# Patient Record
Sex: Male | Born: 1970 | Race: Asian | Hispanic: No | Marital: Married | Smoking: Never smoker
Health system: Southern US, Community
[De-identification: ages and names within clinical notes are randomized; demographics above are authoritative.]

## PROBLEM LIST (undated history)

## (undated) DIAGNOSIS — R7989 Other specified abnormal findings of blood chemistry: Secondary | ICD-10-CM

## (undated) DIAGNOSIS — N200 Calculus of kidney: Secondary | ICD-10-CM

## (undated) DIAGNOSIS — K760 Fatty (change of) liver, not elsewhere classified: Secondary | ICD-10-CM

## (undated) DIAGNOSIS — R945 Abnormal results of liver function studies: Secondary | ICD-10-CM

## (undated) HISTORY — DX: Abnormal results of liver function studies: R94.5

## (undated) HISTORY — DX: Fatty (change of) liver, not elsewhere classified: K76.0

## (undated) HISTORY — DX: Calculus of kidney: N20.0

## (undated) HISTORY — DX: Other specified abnormal findings of blood chemistry: R79.89

---
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
## 2008-08-11 HISTORY — PX: ESOPHAGOGASTRODUODENOSCOPY: SHX1529

## 2014-09-06 ENCOUNTER — Emergency Department (INDEPENDENT_AMBULATORY_CARE_PROVIDER_SITE_OTHER): Payer: 59

## 2014-09-06 ENCOUNTER — Emergency Department
Admission: EM | Admit: 2014-09-06 | Discharge: 2014-09-06 | Disposition: A | Discharge status: Home / Self Care | Payer: 59 | Source: Home / Self Care | Attending: Family Medicine | Admitting: Family Medicine

## 2014-09-06 ENCOUNTER — Encounter: Payer: Self-pay | Admitting: *Deleted

## 2014-09-06 DIAGNOSIS — B9789 Other viral agents as the cause of diseases classified elsewhere: Principal | ICD-10-CM

## 2014-09-06 DIAGNOSIS — R0602 Shortness of breath: Secondary | ICD-10-CM

## 2014-09-06 DIAGNOSIS — R05 Cough: Secondary | ICD-10-CM

## 2014-09-06 DIAGNOSIS — J069 Acute upper respiratory infection, unspecified: Secondary | ICD-10-CM

## 2014-09-06 MED ORDER — BENZONATATE 200 MG PO CAPS: 200.0000 mg | ORAL_CAPSULE | Freq: Every day | ORAL | Status: DC

## 2014-09-06 MED ORDER — CLARITHROMYCIN 250 MG PO TABS: ORAL_TABLET | ORAL | Status: DC

## 2014-09-06 NOTE — ED Notes (Signed)
James Lane c/o 3-4 days of cough, SOB with talking and exertion, sweats, HA and congestion. He c/o CP with breathing.

## 2014-09-06 NOTE — Discharge Instructions (Signed)
Take plain guaifenesin 1200mg  (Mucinex) twice daily, with plenty of water, for cough and congestion.  May add Pseudoephedrine for sinus congestion.  Get adequate rest.   May use Afrin nasal spray (or generic oxymetazoline) twice daily for about 5 days.  Also recommend using saline nasal spray several times daily and saline nasal irrigation (AYR is a common brand) Try warm salt water gargles for sore throat.  Stop all antihistamines for now, and other non-prescription cough/cold preparations. May take Ibuprofen 200mg , 4 tabs every 8 hours with food for sore throat. Begin Clarithromycin (Biaxin) if not improving about one week or if persistent fever develops   Follow-up with family doctor if not improving about10 days.

## 2014-09-06 NOTE — ED Provider Notes (Signed)
CSN: [id number]     Arrival date & time 09/06/14  1711 History   First MD Initiated Contact with Patient 09/06/14 1815     Chief Complaint  Patient presents with  . Cough  . Sinus Problem      HPI Comments: Patient complains of four day history of typical cold-like symptoms including mild sore throat, sinus congestion, headache, fatigue, and cough.   He now complains of shortness of breath with activity and pain in his anterior chest.  His cough is non-productive and worse at night.  He has had sweats but no fever.  The history is provided by the patient.    History reviewed. No pertinent past medical history. Past Surgical History  Procedure Laterality Date  . Appendectomy     History reviewed. No pertinent family history. History  Substance Use Topics  . Smoking status: Never Smoker   . Smokeless tobacco: Never Used  . Alcohol Use: No    Review of Systems + sore throat + cough No pleuritic pain No wheezing + nasal congestion + post-nasal drainage No sinus pain/pressure No itchy/red eyes No earache No hemoptysis + SOB No fever, + chills/sweats No nausea No vomiting No abdominal pain No diarrhea No urinary symptoms No skin rash + fatigue No myalgias + headache Used OTC meds without relief  Allergies  Review of patient's allergies indicates no known allergies.  Home Medications   Prior to Admission medications   Medication Sig Start Date End Date Taking? Authorizing Provider  benzonatate (TESSALON) 200 MG capsule Take 1 capsule (200 mg total) by mouth at bedtime. Take as needed for cough 09/06/14   Lattie HawStephen A Domanique Luckett, MD  clarithromycin (BIAXIN) 250 MG tablet Take one tab by mouth every 12 hours.  Take with food (Rx void after 09/14/14) 09/06/14   Lattie HawStephen A Amous Crewe, MD   BP 124/79 mmHg  Pulse 90  Temp(Src) 98.3 F (36.8 C) (Oral)  Resp 14  Ht 5\' 5"  (1.651 m)  Wt 189 lb (85.73 kg)  BMI 31.45 kg/m2  SpO2 97% Physical Exam Nursing notes and Vital Signs  reviewed. Appearance:  Patient appears stated age, and in no acute distress Eyes:  Pupils are equal, round, and reactive to light and accomodation.  Extraocular movement is intact.  Conjunctivae are not inflamed  Ears:  Canals normal.  Tympanic membranes normal.  Nose:  Mildly congested turbinates.  No sinus tenderness.  Pharynx:  Normal Neck:  Supple.   Tender shotty posterior nodes are palpated bilaterally  Lungs:  Clear to auscultation.  Breath sounds are equal.  Heart:  Regular rate and rhythm without murmurs, rubs, or gallops.  Abdomen:  Nontender without masses or hepatosplenomegaly.  Bowel sounds are present.  No CVA or flank tenderness.  Extremities:  No edema.  No calf tenderness Skin:  No rash present.   ED Course  Procedures  none    Imaging Review Dg Chest 2 View  09/06/2014   CLINICAL DATA:  Shortness of breath and cough for 2 days. Pain in the left upper chest with coughing.  EXAM: CHEST  2 VIEW  COMPARISON:  None.  FINDINGS: Trachea is midline. Heart size normal. Lungs are somewhat low in volume. Probable scarring in the lingula. No airspace consolidation or pleural fluid.  IMPRESSION: No acute findings.   Electronically Signed   By: Leanna BattlesMelinda  Blietz M.D.   On: 09/06/2014 17:50     MDM   1. Viral URI with cough   2. SOB (shortness of breath)  There is no evidence of bacterial infection today.  Treat symptomatically for now  Prescription written for Benzonatate (Tessalon) to take at bedtime for night-time cough.  Take plain guaifenesin  (Mucinex) twice daily, with plenty of water, for cough and congestion.  May add Pseudoephedrine for sinus congestion.  Get adequate rest.   May use Afrin nasal spray (or generic oxymetazoline) twice daily for about 5 days.  Also recommend using saline nasal spray several times daily and saline nasal irrigation (AYR is a common brand) Try warm salt water gargles for sore throat.  Stop all antihistamines for now, and other  non-prescription cough/cold preparations. May take Ibuprofen , 4 tabs every 8 hours with food for sore throat. Begin Clarithromycin (Biaxin) if not improving about one week or if persistent fever develops (Given a prescription to hold, with an expiration date)  Follow-up with family doctor if not improving about10 days.     Lattie Haw, MD 09/08/14 [phone]

## 2014-09-08 ENCOUNTER — Telehealth: Payer: Self-pay | Admitting: Emergency Medicine

## 2016-11-09 DIAGNOSIS — N200 Calculus of kidney: Secondary | ICD-10-CM

## 2016-11-09 DIAGNOSIS — K760 Fatty (change of) liver, not elsewhere classified: Secondary | ICD-10-CM

## 2016-11-09 HISTORY — DX: Calculus of kidney: N20.0

## 2016-11-09 HISTORY — DX: Fatty (change of) liver, not elsewhere classified: K76.0

## 2016-11-29 ENCOUNTER — Emergency Department (HOSPITAL_BASED_OUTPATIENT_CLINIC_OR_DEPARTMENT_OTHER): Payer: 59

## 2016-11-29 ENCOUNTER — Emergency Department (HOSPITAL_BASED_OUTPATIENT_CLINIC_OR_DEPARTMENT_OTHER)
Admission: EM | Admit: 2016-11-29 | Discharge: 2016-11-29 | Disposition: A | Discharge status: Home / Self Care | Payer: 59 | Attending: Emergency Medicine | Admitting: Emergency Medicine

## 2016-11-29 ENCOUNTER — Encounter (HOSPITAL_BASED_OUTPATIENT_CLINIC_OR_DEPARTMENT_OTHER): Payer: Self-pay | Admitting: *Deleted

## 2016-11-29 DIAGNOSIS — N200 Calculus of kidney: Secondary | ICD-10-CM | POA: Insufficient documentation

## 2016-11-29 DIAGNOSIS — R1030 Lower abdominal pain, unspecified: Secondary | ICD-10-CM | POA: Diagnosis present

## 2016-11-29 LAB — LIPASE, BLOOD: Lipase: 34 U/L (ref 11–51)

## 2016-11-29 LAB — COMPREHENSIVE METABOLIC PANEL
ALT: 66 U/L — ABNORMAL HIGH (ref 17–63)
AST: 48 U/L — ABNORMAL HIGH (ref 15–41)
Albumin: 4.5 g/dL (ref 3.5–5.0)
Alkaline Phosphatase: 62 U/L (ref 38–126)
Anion gap: 8 (ref 5–15)
BUN: 18 mg/dL (ref 6–20)
CALCIUM: 9.1 mg/dL (ref 8.9–10.3)
CO2: 25 mmol/L (ref 22–32)
CREATININE: 1.24 mg/dL (ref 0.61–1.24)
Chloride: 104 mmol/L (ref 101–111)
Glucose, Bld: 147 mg/dL — ABNORMAL HIGH (ref 65–99)
Potassium: 3.6 mmol/L (ref 3.5–5.1)
SODIUM: 137 mmol/L (ref 135–145)
TOTAL PROTEIN: 6.8 g/dL (ref 6.5–8.1)
Total Bilirubin: 0.6 mg/dL (ref 0.3–1.2)

## 2016-11-29 LAB — CBC WITH DIFFERENTIAL/PLATELET
Basophils Absolute: 0 10*3/uL (ref 0.0–0.1)
Basophils Relative: 0 %
EOS ABS: 0 10*3/uL (ref 0.0–0.7)
EOS PCT: 0 %
HCT: 46.4 % (ref 39.0–52.0)
Hemoglobin: 16.3 g/dL (ref 13.0–17.0)
LYMPHS ABS: 0.8 10*3/uL (ref 0.7–4.0)
Lymphocytes Relative: 15 %
MCH: 30 pg (ref 26.0–34.0)
MCHC: 35.1 g/dL (ref 30.0–36.0)
MCV: 85.3 fL (ref 78.0–100.0)
Monocytes Absolute: 0.3 10*3/uL (ref 0.1–1.0)
Monocytes Relative: 5 %
Neutro Abs: 4.5 10*3/uL (ref 1.7–7.7)
Neutrophils Relative %: 80 %
Platelets: 210 10*3/uL (ref 150–400)
RBC: 5.44 MIL/uL (ref 4.22–5.81)
RDW: 12 % (ref 11.5–15.5)
WBC: 5.7 10*3/uL (ref 4.0–10.5)

## 2016-11-29 LAB — URINALYSIS, ROUTINE W REFLEX MICROSCOPIC
BILIRUBIN URINE: NEGATIVE
Glucose, UA: NEGATIVE mg/dL
HGB URINE DIPSTICK: NEGATIVE
Ketones, ur: 15 mg/dL — AB
Leukocytes, UA: NEGATIVE
Nitrite: NEGATIVE
PROTEIN: NEGATIVE mg/dL
Specific Gravity, Urine: 1.019 (ref 1.005–1.030)
pH: 7 (ref 5.0–8.0)

## 2016-11-29 MED ORDER — HYDROMORPHONE HCL 1 MG/ML IJ SOLN
1.0000 mg | Freq: Once | INTRAMUSCULAR | Status: AC
  Administered 2016-11-29: 1 mg via INTRAVENOUS
  Filled 2016-11-29: qty 1

## 2016-11-29 MED ORDER — OXYCODONE-ACETAMINOPHEN 5-325 MG PO TABS
2.0000 | ORAL_TABLET | Freq: Once | ORAL | Status: AC
  Administered 2016-11-29: 2 via ORAL
  Filled 2016-11-29: qty 2

## 2016-11-29 MED ORDER — TAMSULOSIN HCL 0.4 MG PO CAPS: 0.4000 mg | ORAL_CAPSULE | Freq: Every day | ORAL | 0 refills | Status: DC

## 2016-11-29 MED ORDER — SODIUM CHLORIDE 0.9 % IV BOLUS (SEPSIS)
1000.0000 mL | Freq: Once | INTRAVENOUS | Status: AC
  Administered 2016-11-29: 1000 mL via INTRAVENOUS

## 2016-11-29 MED ORDER — KETOROLAC TROMETHAMINE 15 MG/ML IJ SOLN
INTRAMUSCULAR | Status: AC
  Administered 2016-11-29: 10 mg via INTRAVENOUS
  Filled 2016-11-29: qty 1

## 2016-11-29 MED ORDER — IBUPROFEN 800 MG PO TABS: 800.0000 mg | ORAL_TABLET | Freq: Three times a day (TID) | ORAL | 0 refills | Status: DC

## 2016-11-29 MED ORDER — ONDANSETRON HCL 4 MG/2ML IJ SOLN
4.0000 mg | Freq: Once | INTRAMUSCULAR | Status: AC
  Administered 2016-11-29: 4 mg via INTRAVENOUS
  Filled 2016-11-29: qty 2

## 2016-11-29 MED ORDER — TAMSULOSIN HCL 0.4 MG PO CAPS
0.4000 mg | ORAL_CAPSULE | Freq: Once | ORAL | Status: AC
  Administered 2016-11-29: 0.4 mg via ORAL
  Filled 2016-11-29: qty 1

## 2016-11-29 MED ORDER — SODIUM CHLORIDE 0.9 % IV SOLN: 1000.0000 mL | INTRAVENOUS | Status: DC

## 2016-11-29 MED ORDER — ONDANSETRON 4 MG PO TBDP: 4.0000 mg | ORAL_TABLET | ORAL | 0 refills | Status: DC | PRN

## 2016-11-29 MED ORDER — KETOROLAC TROMETHAMINE 15 MG/ML IJ SOLN
10.0000 mg | Freq: Once | INTRAMUSCULAR | Status: AC
  Administered 2016-11-29: 10 mg via INTRAVENOUS

## 2016-11-29 MED ORDER — SODIUM CHLORIDE 0.9 % IV SOLN
1000.0000 mL | Freq: Once | INTRAVENOUS | Status: AC
  Administered 2016-11-29: 1000 mL via INTRAVENOUS

## 2016-11-29 MED ORDER — OXYCODONE-ACETAMINOPHEN 5-325 MG PO TABS: 2.0000 | ORAL_TABLET | ORAL | 0 refills | Status: DC | PRN

## 2016-11-29 NOTE — ED Provider Notes (Signed)
[facility] Provider Note   CSN: [id number] Arrival date & time: 11/29/16  4098     History   Chief Complaint Chief Complaint  Patient presents with  . Flank Pain    HPI James Lane is a 46 y.o. male.  HPI Patient went to bed feeling well. 4 AM sudden onset of severe pain in the right flank and lower abdomen. Pain is sharp. Radiation to the testicle and the penis. Currently he reports most of the pain is now in his lower back. Nausea but no vomiting. Never similar. History of appendectomy. No recent pain burning or urgency with urination. History reviewed. No pertinent past medical history.  There are no active problems to display for this patient.   Past Surgical History:  Procedure Laterality Date  . APPENDECTOMY         Home Medications    Prior to Admission medications   Medication Sig Start Date End Date Taking? Authorizing Provider  benzonatate (TESSALON) 200 MG capsule Take 1 capsule (200 mg total) by mouth at bedtime. Take as needed for cough 09/06/14   Lattie Haw, MD  clarithromycin (BIAXIN) 250 MG tablet Take one tab by mouth every 12 hours.  Take with food (Rx void after 09/14/14) 09/06/14   Lattie Haw, MD    Family History No family history on file.  Social History Social History  Substance Use Topics  . Smoking status: Never Smoker  . Smokeless tobacco: Never Used  . Alcohol use No     Allergies   Patient has no known allergies.   Review of Systems Review of Systems 10 Systems reviewed and are negative for acute change except as noted in the HPI.   Physical Exam Updated Vital Signs BP 120/76 (BP Location: Right Arm)   Pulse 87   Temp 97.1 F (36.2 C) (Oral)   Resp 18   Ht  (1.651 m)   Wt 180 lb (81.6 kg)   SpO2 96%   BMI 29.95 kg/m   Physical Exam  Constitutional: He is oriented to person, place, and time. He appears well-developed and well-nourished.  HENT:  Head: Normocephalic and atraumatic.  Eyes:  Conjunctivae are normal.  Neck: Neck supple.  Cardiovascular: Normal rate and regular rhythm.   No murmur heard. Pulmonary/Chest: Effort normal and breath sounds normal. No respiratory distress.  Abdominal: Soft. There is tenderness.  Mild right lateral quadrant discomfort to palpation. No guarding. No mass or fullness in inguinal canal.  Musculoskeletal: He exhibits no edema or tenderness.  Neurological: He is alert and oriented to person, place, and time. He exhibits normal muscle tone. Coordination normal.  Skin: Skin is warm and dry.  Psychiatric: He has a normal mood and affect.  Nursing note and vitals reviewed.    ED Treatments / Results  Labs (all labs ordered are listed, but only abnormal results are displayed) Labs Reviewed  COMPREHENSIVE METABOLIC PANEL - Abnormal; Notable for the following:       Result Value   Glucose, Bld 147 (*)    AST 48 (*)    ALT 66 (*)    All other components within normal limits  LIPASE, BLOOD  CBC WITH DIFFERENTIAL/PLATELET  URINALYSIS, ROUTINE W REFLEX MICROSCOPIC    EKG  EKG Interpretation None       Radiology Ct Renal Stone Study  Result Date: 11/29/2016 CLINICAL DATA:  Patient with acute onset right flank pain. EXAM: CT ABDOMEN AND PELVIS WITHOUT CONTRAST TECHNIQUE: Multidetector CT imaging  of the abdomen and pelvis was performed following the standard protocol without IV contrast. COMPARISON:  None. FINDINGS: Lower chest: Normal heart size. Dependent atelectasis within the bilateral lower lobes. Hepatobiliary: Liver is diffusely low in attenuation compatible with steatosis. High attenuation adjacent to the gallbladder fossa favored to represent focal fatty sparing. Gallbladder is unremarkable. Pancreas: Unremarkable Spleen: Unremarkable Adrenals/Urinary Tract: The adrenal glands are normal. The right kidney is edematous with perinephric fat stranding. There is mild to moderate right hydronephrosis and ureterectasis to the level of the  distal right ureter at the UVJ were there is an obstructing 4 mm stone (image 78; series 2). No additional renal stones are demonstrated bilaterally. Stomach/Bowel: No abnormal bowel wall thickening or evidence for bowel obstruction. No free fluid or free intraperitoneal air. Normal morphology of the stomach. Vascular/Lymphatic: Normal caliber abdominal aorta. No retroperitoneal lymphadenopathy. Reproductive: Prostate is unremarkable. Other: Soft tissue within the inguinal canals bilaterally favored to represent mildly retracted testicles. Musculoskeletal: No aggressive or acute appearing osseous lesions. IMPRESSION: Obstructing 4 mm stone within the distal right ureter at the UVJ with mild to moderate right hydroureteronephrosis. No additional renal stones are identified. Hepatic steatosis. Electronically Signed   By: Annia Belt M.D.   On: 11/29/2016 08:00    Procedures Procedures (including critical care time)  Medications Ordered in ED Medications  0.9 %  sodium chloride infusion (1,000 mLs Intravenous New Bag/Given 11/29/16 0740)    Followed by  0.9 %  sodium chloride infusion (not administered)  oxyCODONE-acetaminophen (PERCOCET/ROXICET) 5-325 MG per tablet 2 tablet (not administered)  tamsulosin (FLOMAX) capsule 0.4 mg (not administered)  sodium chloride 0.9 % bolus 1,000 mL (not administered)  ketorolac (TORADOL) 15 MG/ML injection 10 mg (10 mg Intravenous Given 11/29/16 0653)  HYDROmorphone (DILAUDID) injection 1 mg (1 mg Intravenous Given 11/29/16 0654)  ondansetron (ZOFRAN) injection 4 mg (4 mg Intravenous Given 11/29/16 0653)  HYDROmorphone (DILAUDID) injection 1 mg (1 mg Intravenous Given 11/29/16 0744)     Initial Impression / Assessment and Plan / ED Course  I have reviewed the triage vital signs and the nursing notes.  Pertinent labs & imaging results that were available during my care of the patient were reviewed by me and considered in my medical decision making (see chart for  details).      Final Clinical Impressions(s) / ED Diagnoses   Final diagnoses:  Kidney stone   4 mm right ureteral stone with mild to moderate hydronephrosis. Patient has an pain controlled with Dilaudid, Toradol and fluids. Urine strainer provided, return precautions reviewed, plan for follow-up with urology. New Prescriptions New Prescriptions   No medications on file     Arby Barrette, MD 11/29/16 [phone]

## 2016-11-29 NOTE — ED Triage Notes (Signed)
c/o back radiating to rt flank and abd

## 2016-11-29 NOTE — ED Notes (Signed)
Patient transported to CT 

## 2016-11-29 NOTE — ED Notes (Signed)
Pt reports vomiting after trying to drink approx 60ml water at one time; instructed pt to take sips of water. MD aware -- okay to DC.

## 2016-11-29 NOTE — ED Notes (Signed)
Voided of yellow urine, strainer used, no stone noted

## 2016-11-29 NOTE — ED Notes (Signed)
Instructed on using urine strainer

## 2016-11-29 NOTE — ED Notes (Signed)
Pt denies n/v at this time.  

## 2017-06-15 ENCOUNTER — Encounter: Payer: Self-pay | Admitting: Family Medicine

## 2017-06-15 ENCOUNTER — Ambulatory Visit (INDEPENDENT_AMBULATORY_CARE_PROVIDER_SITE_OTHER): Payer: 59 | Admitting: Family Medicine

## 2017-06-15 VITALS — BP 117/79 | HR 86 | Temp 98.0°F | Resp 20 | Ht 65.0 in | Wt 182.8 lb

## 2017-06-15 DIAGNOSIS — M799 Soft tissue disorder, unspecified: Secondary | ICD-10-CM | POA: Diagnosis not present

## 2017-06-15 DIAGNOSIS — K760 Fatty (change of) liver, not elsewhere classified: Secondary | ICD-10-CM

## 2017-06-15 DIAGNOSIS — Z0000 Encounter for general adult medical examination without abnormal findings: Secondary | ICD-10-CM | POA: Diagnosis not present

## 2017-06-15 DIAGNOSIS — R748 Abnormal levels of other serum enzymes: Secondary | ICD-10-CM | POA: Insufficient documentation

## 2017-06-15 DIAGNOSIS — R591 Generalized enlarged lymph nodes: Secondary | ICD-10-CM | POA: Diagnosis not present

## 2017-06-15 DIAGNOSIS — M7989 Other specified soft tissue disorders: Secondary | ICD-10-CM | POA: Insufficient documentation

## 2017-06-15 DIAGNOSIS — Z6830 Body mass index (BMI) 30.0-30.9, adult: Secondary | ICD-10-CM

## 2017-06-15 DIAGNOSIS — R7309 Other abnormal glucose: Secondary | ICD-10-CM

## 2017-06-15 DIAGNOSIS — Z7689 Persons encountering health services in other specified circumstances: Secondary | ICD-10-CM | POA: Diagnosis not present

## 2017-06-15 NOTE — Progress Notes (Signed)
Patient ID: James Lane, male  DOB: 1971-03-25, 46 y.o.   MRN: [id number] Patient Care Team    Relationship Specialty Notifications Start End  James Leatherwood, DO PCP - General Family Medicine  06/15/17     Chief Complaint  Patient presents with  . Annual Exam    Subjective:  James Lane is a 46 y.o. male present for CPE and establishment All past medical history, surgical history, allergies, family history, immunizations, medications and social history were obtained and entered in the electronic medical record today. All recent labs, ED visits and hospitalizations within the last year were reviewed. Patient moved to the [place] in 2005 from [place].  His complaints today surround multiple soft tissue masses of bilateral elbows, bilateral thighs. He states the masses in his left anterior thigh have been present for a couple years, however over the last year they have become larger. He states at times they are uncomfortable. He is worried it could be cancerous. He does not feel there is a family history present.  Health maintenance:  Colonoscopy: Start screening at age 31, no family history Immunizations:  tdap completed in 2005, patient will think about immunization, influenza declined Infectious disease screening: HIV completed 2005  Assistive device: None Oxygen use: None Patient has a Dental home. Hospitalizations/ED visits: None  Depression screen [facility] 2/9 06/15/2017  Decreased Interest 0  Down, Depressed, Hopeless 0  PHQ - 2 Score 0   No flowsheet data found.   Current Exercise Habits: Home exercise routine, Type of exercise: walking(biking), Time (Minutes): 30, Frequency (Times/Week): 5, Weekly Exercise (Minutes/Week): 150, Intensity: Moderate   No flowsheet data found.     There is no immunization history on file for this patient.   Past Medical History:  Diagnosis Date  . Elevated LFTs    long term, hepatic steatosis on CT (2018)  . Hepatic steatosis 11/2016   CT finding , pt has elevated LFT for many years  . Kidney stones 11/2016   No Known Allergies Past Surgical History:  Procedure Laterality Date  . APPENDECTOMY  1983  . ESOPHAGOGASTRODUODENOSCOPY  2010   Patient reports completed for GERD symptoms with negative  biopsies.   History reviewed. No pertinent family history. Social History   Socioeconomic History  . Marital status: Married    Spouse name: Not on file  . Number of children: Not on file  . Years of education: 50  . Highest education level: Not on file  Social Needs  . Financial resource strain: Not on file  . Food insecurity - worry: Not on file  . Food insecurity - inability: Not on file  . Transportation needs - medical: Not on file  . Transportation needs - non-medical: Not on file  Occupational History  . Occupation: Art gallery manager  Tobacco Use  . Smoking status: Never Smoker  . Smokeless tobacco: Never Used  Substance and Sexual Activity  . Alcohol use: No  . Drug use: No  . Sexual activity: Yes    Partners: Female  Other Topics Concern  . Not on file  Social History Narrative   Patient is married, they have 1 child.   College-educated, works as an Art gallery manager.   Wears his seatbelt, with uses a bicycle helmet.   Exercises routinely.   Takes a daily vitamin.   Smoke detector in the home.   Feels safe in his relationships.   Allergies as of 06/15/2017   No Known Allergies     Medication List  as of 06/15/2017  4:38 PM   You have not been prescribed any medications.    All past medical history, surgical history, allergies, family history, immunizations andmedications were updated in the EMR today and reviewed under the history and medication portions of their EMR.     No results found for this or any previous visit (from the past 2160 hour(s)).  Ct Renal Stone Study  Result Date: 11/29/2016 CLINICAL DATA:  Patient with acute onset right flank pain. EXAM: CT ABDOMEN AND PELVIS WITHOUT CONTRAST  TECHNIQUE: Multidetector CT imaging of the abdomen and pelvis was performed following the standard protocol without IV contrast. COMPARISON:  None. FINDINGS: Lower chest: Normal heart size. Dependent atelectasis within the bilateral lower lobes. Hepatobiliary: Liver is diffusely low in attenuation compatible with steatosis. High attenuation adjacent to the gallbladder fossa favored to represent focal fatty sparing. Gallbladder is unremarkable. Pancreas: Unremarkable Spleen: Unremarkable Adrenals/Urinary Tract: The adrenal glands are normal. The right kidney is edematous with perinephric fat stranding. There is mild to moderate right hydronephrosis and ureterectasis to the level of the distal right ureter at the UVJ were there is an obstructing 4 mm stone (image 78; series 2). No additional renal stones are demonstrated bilaterally. Stomach/Bowel: No abnormal bowel wall thickening or evidence for bowel obstruction. No free fluid or free intraperitoneal air. Normal morphology of the stomach. Vascular/Lymphatic: Normal caliber abdominal aorta. No retroperitoneal lymphadenopathy. Reproductive: Prostate is unremarkable. Other: Soft tissue within the inguinal canals bilaterally favored to represent mildly retracted testicles. Musculoskeletal: No aggressive or acute appearing osseous lesions. IMPRESSION: Obstructing 4 mm stone within the distal right ureter at the UVJ with mild to moderate right hydroureteronephrosis. No additional renal stones are identified. Hepatic steatosis. Electronically Signed   By: Annia Beltrew  Davis M.D.   On: 11/29/2016 08:00     ROS: 14 pt review of systems performed and negative (unless mentioned in an HPI)  Objective: BP 117/79 (BP Location: Left Arm, Patient Position: Sitting, Cuff Size: Large)   Pulse 86   Temp 98 F (36.7 C)   Resp 20   Ht 5\' 5"  (1.651 m)   Wt 182 lb 12 oz (82.9 kg)   SpO2 98%   BMI 30.41 kg/m  Gen: Afebrile. No acute distress. Nontoxic in appearance,  well-developed, well-nourished,  pleasant Asian male. HENT: AT. [place]. Bilateral TM visualized and normal in appearance, normal external auditory canal. MMM, no oral lesions, adequate dentition. Bilateral nares within normal limits. Throat without erythema, ulcerations or exudates. no Cough on exam, no hoarseness on exam. Eyes:Pupils Equal Round Reactive to light, Extraocular movements intact,  Conjunctiva without redness, discharge or icterus. Neck/lymp/endocrine: Supple,no head/neck lymphadenopathy, no thyromegaly CV: RRR no murmur, no edema, +2/4 P posterior tibialis pulses. No carotid bruits. No JVD. Chest: CTAB, no wheeze, rhonchi or crackles.  Abd: Soft. NTND. BS present. No Masses palpated. No hepatosplenomegaly. No rebound tenderness or guarding. Skin: No rashes, purpura or petechiae. Warm and well-perfused. Skin intact. Neuro/Msk:  Normal gait. PERLA. EOMi. Alert. Oriented x3.  Cranial nerves II through XII intact. ~1 cm bilateral hard mobile mass distal bicep, left superior anterior thigh x2 hard mobile ~3 cm ea. x1 small inguinal/groin mass left. x2 1-2 cm masses right superior anterior thigh. Nontender. Psych: Normal affect, dress and demeanor. Normal speech. Normal thought content and judgment.  No exam data present  Assessment/plan: James Lane is a 46 y.o. male present for CPE Encounter for preventive health examination Patient was encouraged to exercise greater than 150 minutes a  week. Patient was encouraged to choose a diet filled with fresh fruits and vegetables, and lean meats. AVS provided to patient today for education/recommendation on gender specific health and safety maintenance. Declined flu shot today. Thinking about the tetanus vaccination. - Infectious disease screening up-to-date. Mass of soft tissue/lymphadenopathy - Multiple masses bilateral extremities, ? Neurofibroma versus lipoma versus lymphadenopathy. Has been present for some time, with enlarging masses of the left  anterior thigh. Therefore will focus on these for a ultrasound to start evaluation. - [place] MiscellaneoUS Localization; Future - CBC with differential Elevated liver enzymes/elevated liver enzymes Sounds chronic. Patient does have hepatic steatosis by recent CT. This is probable cause of elevated liver enzymes. Monitor. - HIV up-to-date, will complete hepatitis C screen  for elevated enzymes. Elevated glucose/BMI 30 - A1c   Return in about 1 year (around 06/15/2018) for CPE.  Note is dictated utilizing voice recognition software. Although note has been proof read prior to signing, occasional typographical errors still can be missed. If any questions arise, please do not hesitate to call for verification.  Electronically signed by: Felix Pacini, DO [facility] [place]

## 2017-06-15 NOTE — Patient Instructions (Signed)
Think about getting the tetanus shot and flu shot. You will get a call to schedule image of the leg to further investigate the masses.   Schedule a lab visit only, fasting (water only for 9 hours) to have lab work collected within a week.  We will call once results available.     Health Maintenance, Male A healthy lifestyle and preventive care is important for your health and wellness. Ask your health care provider about what schedule of regular examinations is right for you. What should I know about weight and diet? Eat a Healthy Diet  Eat plenty of vegetables, fruits, whole grains, low-fat dairy products, and lean protein.  Do not eat a lot of foods high in solid fats, added sugars, or salt.  Maintain a Healthy Weight Regular exercise can help you achieve or maintain a healthy weight. You should:  Do at least 150 minutes of exercise each week. The exercise should increase your heart rate and make you sweat (moderate-intensity exercise).  Do strength-training exercises at least twice a week.  Watch Your Levels of Cholesterol and Blood Lipids  Have your blood tested for lipids and cholesterol every 5 years starting at 45 years of age. If you are at high risk for heart disease, you should start having your blood tested when you are 45 years old. You may need to have your cholesterol levels checked more often if: ? Your lipid or cholesterol levels are high. ? You are older than 45 years of age. ? You are at high risk for heart disease.  What should I know about cancer screening? Many types of cancers can be detected early and may often be prevented. Lung Cancer  You should be screened every year for lung cancer if: ? You are a current smoker who has smoked for at least 30 years. ? You are a former smoker who has quit within the past 15 years.  Talk to your health care provider about your screening options, when you should start screening, and how often you should be  screened.  Colorectal Cancer  Routine colorectal cancer screening usually begins at 45 years of age and should be repeated every 5-10 years until you are 45 years old. You may need to be screened more often if early forms of precancerous polyps or small growths are found. Your health care provider may recommend screening at an earlier age if you have risk factors for colon cancer.  Your health care provider may recommend using home test kits to check for hidden blood in the stool.  A small camera at the end of a tube can be used to examine your colon (sigmoidoscopy or colonoscopy). This checks for the earliest forms of colorectal cancer.  Prostate and Testicular Cancer  Depending on your age and overall health, your health care provider may do certain tests to screen for prostate and testicular cancer.  Talk to your health care provider about any symptoms or concerns you have about testicular or prostate cancer.  Skin Cancer  Check your skin from head to toe regularly.  Tell your health care provider about any new moles or changes in moles, especially if: ? There is a change in a mole's size, shape, or color. ? You have a mole that is larger than a pencil eraser.  Always use sunscreen. Apply sunscreen liberally and repeat throughout the day.  Protect yourself by wearing long sleeves, pants, a wide-brimmed hat, and sunglasses when outside.  What should I know about heart  disease, diabetes, and high blood pressure?  If you are 80-63 years of age, have your blood pressure checked every 3-5 years. If you are 90 years of age or older, have your blood pressure checked every year. You should have your blood pressure measured twice-once when you are at a hospital or clinic, and once when you are not at a hospital or clinic. Record the average of the two measurements. To check your blood pressure when you are not at a hospital or clinic, you can use: ? An automated blood pressure machine at a  pharmacy. ? A home blood pressure monitor.  Talk to your health care provider about your target blood pressure.  If you are between 62-32 years old, ask your health care provider if you should take aspirin to prevent heart disease.  Have regular diabetes screenings by checking your fasting blood sugar level. ? If you are at a normal weight and have a low risk for diabetes, have this test once every three years after the age of 75. ? If you are overweight and have a high risk for diabetes, consider being tested at a younger age or more often.  A one-time screening for abdominal aortic aneurysm (AAA) by ultrasound is recommended for men aged 65-75 years who are current or former smokers. What should I know about preventing infection? Hepatitis B If you have a higher risk for hepatitis B, you should be screened for this virus. Talk with your health care provider to find out if you are at risk for hepatitis B infection. Hepatitis C Blood testing is recommended for:  Everyone born from [birth year] through 1964.  Anyone with known risk factors for hepatitis C.  Sexually Transmitted Diseases (STDs)  You should be screened each year for STDs including gonorrhea and chlamydia if: ? You are sexually active and are younger than 45 years of age. ? You are older than 45 years of age and your health care provider tells you that you are at risk for this type of infection. ? Your sexual activity has changed since you were last screened and you are at an increased risk for chlamydia or gonorrhea. Ask your health care provider if you are at risk.  Talk with your health care provider about whether you are at high risk of being infected with HIV. Your health care provider may recommend a prescription medicine to help prevent HIV infection.  What else can I do?  Schedule regular health, dental, and eye exams.  Stay current with your vaccines (immunizations).  Do not use any tobacco products, such as  cigarettes, chewing tobacco, and e-cigarettes. If you need help quitting, ask your health care provider.  Limit alcohol intake to no more than 2 drinks per day. One drink equals 12 ounces of beer, 5 ounces of wine, or 1 ounces of hard liquor.  Do not use street drugs.  Do not share needles.  Ask your health care provider for help if you need support or information about quitting drugs.  Tell your health care provider if you often feel depressed.  Tell your health care provider if you have ever been abused or do not feel safe at home. This information is not intended to replace advice given to you by your health care provider. Make sure you discuss any questions you have with your health care provider. Document Released: 01/24/2008 Document Revised: 03/26/2016 Document Reviewed: 05/01/2015 Elsevier Interactive Patient Education  [employer].  Please help [place] help you:  We  are honored you have chosen [facility] for your Primary Care home. Below you will find basic instructions that you may need to access in the future. Please help [place] help you by reading the instructions, which cover many of the frequent questions we experience.   Prescription refills and request:  -In order to allow more efficient response time, please call your pharmacy for all refills. They will forward the request electronically to [place]. This allows for the quickest possible response. Request left on a nurse line can take longer to refill, since these are checked as time allows between office patients and other phone calls.  - refill request can take up to 3-5 working days to complete.  - If request is sent electronically and request is appropiate, it is usually completed in 1-2 business days.  - all patients will need to be seen routinely for all chronic medical conditions requiring prescription medications (see follow-up below). If you are overdue for follow up on your condition, you will be asked to make an  appointment and we will call in enough medication to cover you until your appointment (up to 30 days).  - all controlled substances will require a face to face visit to request/refill.  - if you desire your prescriptions to go through a new pharmacy, and have an active script at original pharmacy, you will need to call your pharmacy and have scripts transferred to new pharmacy. This is completed between the pharmacy locations and not by your provider.    Results: If any images or labs were ordered, it can take up to 1 week to get results depending on the test ordered and the lab/facility running and resulting the test. - Normal or stable results, which do not need further discussion, may be released to your mychart immediately with attached note to you. A call may not be generated for normal results. Please make certain to sign up for mychart. If you have questions on how to activate your mychart you can call the front office.  - If your results need further discussion, our office will attempt to contact you via phone, and if unable to reach you after 2 attempts, we will release your abnormal result to your mychart with instructions.  - All results will be automatically released in mychart after 1 week.  - Your provider will provide you with explanation and instruction on all relevant material in your results. Please keep in mind, results and labs may appear confusing or abnormal to the untrained eye, but it does not mean they are actually abnormal for you personally. If you have any questions about your results that are not covered, or you desire more detailed explanation than what was provided, you should make an appointment with your provider to do so.   Our office handles many outgoing and incoming calls daily. If we have not contacted you within 1 week about your results, please check your mychart to see if there is a message first and if not, then contact our office.  In helping with this matter,  you help decrease call volume, and therefore allow [place] to be able to respond to patients needs more efficiently.   Acute office visits (sick visit):  An acute visit is intended for a new problem and are scheduled in shorter time slots to allow schedule openings for patients with new problems. This is the appropriate visit to discuss a new problem. In order to provide you with excellent quality medical care with proper  time for you to explain your problem, have an exam and receive treatment with instructions, these appointments should be limited to one new problem per visit. If you experience a new problem, in which you desire to be addressed, please make an acute office visit, we save openings on the schedule to accommodate you. Please do not save your new problem for any other type of visit, let us take care of it properly and quickly for you.   Follow up visits:  Depending on your condition(s) your provider will need to see you routinely in order to provide you with quality care and prescribe medication(s). Most chronic conditions (Example: hypertension, Diabetes, depression/anxiety... etc), require visits a couple times a year. Your provider will instruct you on proper follow up for your personal medical conditions and history. Please make certain to make follow up appointments for your condition as instructed. Failing to do so could result in lapse in your medication treatment/refills. If you request a refill, and are overdue to be seen on a condition, we will always provide you with a 30 day script (once) to allow you time to schedule.    Medicare wellness (well visit): - we have a wonderful Nurse Selena Batten), that will meet with you and provide you will yearly medicare wellness visits. These visits should occur yearly (can not be scheduled less than 1 calendar year apart) and cover preventive health, immunizations, advance directives and screenings you are entitled to yearly through your medicare benefits.  Do not miss out on your entitled benefits, this is when medicare will pay for these benefits to be ordered for you.  These are strongly encouraged by your provider and is the appropriate type of visit to make certain you are up to date with all preventive health benefits. If you have not had your medicare wellness exam in the last 12 months, please make certain to schedule one by calling the office and schedule your medicare wellness with Selena Batten as soon as possible.   Yearly physical (well visit):  - Adults are recommended to be seen yearly for physicals. Check with your insurance and date of your last physical, most insurances require one calendar year between physicals. Physicals include all preventive health topics, screenings, medical exam and labs that are appropriate for gender/age and history. You may have fasting labs needed at this visit. This is a well visit (not a sick visit), new problems should not be covered during this visit (see acute visit).  - Pediatric patients are seen more frequently when they are younger. Your provider will advise you on well child visit timing that is appropriate for your their age. - This is not a medicare wellness visit. Medicare wellness exams do not have an exam portion to the visit. Some medicare companies allow for a physical, some do not allow a yearly physical. If your medicare allows a yearly physical you can schedule the medicare wellness with our nurse Selena Batten and have your physical with your provider after, on the same day. Please check with insurance for your full benefits.   Late Policy/No Shows:  - all new patients should arrive 15-30 minutes earlier than appointment to allow [place] time  to  obtain all personal demographics,  insurance information and for you to complete office paperwork. - All established patients should arrive 10-15 minutes earlier than appointment time to update all information and be checked in .  - In our best efforts to run on time, if you  are late for your appointment  you will be asked to either reschedule or if able, we will work you back into the schedule. There will be a wait time to work you back in the schedule,  depending on availability.  - If you are unable to make it to your appointment as scheduled, please call 24 hours ahead of time to allow [place] to fill the time slot with someone else who needs to be seen. If you do not cancel your appointment ahead of time, you may be charged a no show fee.

## 2017-06-18 ENCOUNTER — Other Ambulatory Visit (INDEPENDENT_AMBULATORY_CARE_PROVIDER_SITE_OTHER): Payer: 59

## 2017-06-18 DIAGNOSIS — K760 Fatty (change of) liver, not elsewhere classified: Secondary | ICD-10-CM | POA: Diagnosis not present

## 2017-06-18 DIAGNOSIS — M799 Soft tissue disorder, unspecified: Secondary | ICD-10-CM

## 2017-06-18 DIAGNOSIS — Z6830 Body mass index (BMI) 30.0-30.9, adult: Secondary | ICD-10-CM

## 2017-06-18 DIAGNOSIS — R748 Abnormal levels of other serum enzymes: Secondary | ICD-10-CM

## 2017-06-18 DIAGNOSIS — R7309 Other abnormal glucose: Secondary | ICD-10-CM

## 2017-06-18 DIAGNOSIS — M7989 Other specified soft tissue disorders: Secondary | ICD-10-CM

## 2017-06-18 LAB — CBC WITH DIFFERENTIAL/PLATELET
Basophils Absolute: 0.1 10*3/uL (ref 0.0–0.1)
Basophils Relative: 0.9 % (ref 0.0–3.0)
EOS PCT: 1 % (ref 0.0–5.0)
Eosinophils Absolute: 0.1 10*3/uL (ref 0.0–0.7)
HCT: 49.8 % (ref 39.0–52.0)
HEMOGLOBIN: 16.8 g/dL (ref 13.0–17.0)
Lymphocytes Relative: 25.2 % (ref 12.0–46.0)
Lymphs Abs: 1.5 10*3/uL (ref 0.7–4.0)
MCHC: 33.7 g/dL (ref 30.0–36.0)
MCV: 89.1 fl (ref 78.0–100.0)
MONOS PCT: 6.8 % (ref 3.0–12.0)
Monocytes Absolute: 0.4 10*3/uL (ref 0.1–1.0)
Neutro Abs: 4 10*3/uL (ref 1.4–7.7)
Neutrophils Relative %: 66.1 % (ref 43.0–77.0)
Platelets: 228 10*3/uL (ref 150.0–400.0)
RBC: 5.6 Mil/uL (ref 4.22–5.81)
RDW: 12.6 % (ref 11.5–15.5)
WBC: 6 10*3/uL (ref 4.0–10.5)

## 2017-06-18 LAB — LIPID PANEL
CHOLESTEROL: 154 mg/dL (ref 0–200)
HDL: 34.2 mg/dL — ABNORMAL LOW (ref >39.00)
NonHDL: 119.63
Total CHOL/HDL Ratio: 4
Triglycerides: 243 mg/dL — ABNORMAL HIGH (ref 0.0–149.0)
VLDL: 48.6 mg/dL — AB (ref 0.0–40.0)

## 2017-06-18 LAB — COMPREHENSIVE METABOLIC PANEL
ALBUMIN: 4.5 g/dL (ref 3.5–5.2)
ALK PHOS: 73 U/L (ref 39–117)
ALT: 28 U/L (ref 0–53)
AST: 22 U/L (ref 0–37)
BUN: 14 mg/dL (ref 6–23)
CO2: 32 mEq/L (ref 19–32)
Calcium: 9.8 mg/dL (ref 8.4–10.5)
Chloride: 100 mEq/L (ref 96–112)
Creatinine, Ser: 1.05 mg/dL (ref 0.40–1.50)
GFR: 81.1 mL/min (ref >60.00)
Glucose, Bld: 105 mg/dL — ABNORMAL HIGH (ref 70–99)
Potassium: 4.3 mEq/L (ref 3.5–5.1)
Sodium: 138 mEq/L (ref 135–145)
TOTAL PROTEIN: 7.1 g/dL (ref 6.0–8.3)
Total Bilirubin: 1.1 mg/dL (ref 0.2–1.2)

## 2017-06-18 LAB — HEMOGLOBIN A1C: Hgb A1c MFr Bld: 5.8 % (ref 4.6–6.5)

## 2017-06-18 LAB — LDL CHOLESTEROL, DIRECT: Direct LDL: 90 mg/dL

## 2017-06-19 ENCOUNTER — Telehealth: Payer: Self-pay | Admitting: Family Medicine

## 2017-06-19 DIAGNOSIS — E781 Pure hyperglyceridemia: Secondary | ICD-10-CM

## 2017-06-19 LAB — HEPATITIS C ANTIBODY
Hepatitis C Ab: NONREACTIVE
SIGNAL TO CUT-OFF: 0.01 (ref <1.00)

## 2017-06-19 MED ORDER — OMEGA-3-ACID ETHYL ESTERS 1 G PO CAPS: 2.0000 g | ORAL_CAPSULE | Freq: Every day | ORAL | 3 refills | Status: AC

## 2017-06-19 NOTE — Telephone Encounter (Signed)
Please call pt: - his labs are stable with the following exceptions his good cholesterol is low and his triglycerides are too high. I have called in a medication for him to start called Lovaza which is an omega-3 fish oil supplement. It helps increase the good cholesterol and decrease the triglycerides. If liver enzymes are now normal, they had been abnormal in the past. His diabetes screening is 5.8, which is mildly elevated. He is not a diabetic, however he should increase his exercise and cut back on sugary foods in order to decrease his A1c to normal range and not become a diabetic.   Follow-up in 3-6 months for repeat cholesterol panel after starting medication.

## 2017-06-19 NOTE — Addendum Note (Signed)
Addended by: Thomasena EdisWILLIAMS, Cornelis Kluver N on: 06/19/2017 03:03 PM   Modules accepted: Orders

## 2017-06-22 NOTE — Telephone Encounter (Signed)
Left message with results and instructions on patient voice mail per DPR 

## 2017-06-23 ENCOUNTER — Other Ambulatory Visit: Payer: 59

## 2017-06-30 ENCOUNTER — Ambulatory Visit
Admission: RE | Admit: 2017-06-30 | Discharge: 2017-06-30 | Disposition: A | Discharge status: Home / Self Care | Payer: 59 | Source: Ambulatory Visit | Attending: Family Medicine | Admitting: Family Medicine

## 2017-06-30 DIAGNOSIS — R591 Generalized enlarged lymph nodes: Secondary | ICD-10-CM

## 2017-07-01 ENCOUNTER — Telehealth: Payer: Self-pay | Admitting: Family Medicine

## 2017-07-01 DIAGNOSIS — R2242 Localized swelling, mass and lump, left lower limb: Secondary | ICD-10-CM

## 2017-07-01 NOTE — Telephone Encounter (Signed)
Please call pt: - I received a report on his US of his thigh masses. The radiologist recommend further imaging to get a better look at the masses mid thigh. They are uncertain at this time if just fatty masses called lipomas or a more serious condition and we need a better image to identify,  I placed an order for the MRI of his leg. He will need an appt with me to follow up 2-5 days after MRI completed to discuss all results.    Rosalita Chessman- Suzanne: please call radiology dept and ask what order number they recommend given there is no order for thigh MRI and Femur MRI which is the closest order I can find is not exactly accurate and may be misleading.

## 2017-07-06 NOTE — Telephone Encounter (Signed)
Spoke with patient reviewed US results and instructions. Patient verbalized understanding .  

## 2017-07-06 NOTE — Telephone Encounter (Signed)
MRI ordered placed

## 2017-07-08 ENCOUNTER — Telehealth: Payer: Self-pay | Admitting: *Deleted

## 2017-07-08 NOTE — Telephone Encounter (Signed)
Copied from CRM [id number]. Topic: Inquiry >> Jul 07, 2017  2:05 PM Waymon AmatoBurton, Donna F wrote: Reason for CRM: pt is needing to get his ultrasound results sent to his home address and needs to talk with the provider as does he really need to do an MRI  Best number [phone]   Spoke with patient explained to him if he wanted to review US results with Dr Claiborne BillingsKuneff he would need to schedule an appt. US results printed and placed up front for pick up. Patient states he will pick up copy in the am. Patient states he doesn't understand why the Dr cant just call him on the phone and answer his questions. Explained to patient Dr Claiborne BillingsKuneff will review in person only when additional tests are required so all questions can be addressed at that time. Patient verbalized understanding.

## 2017-08-07 ENCOUNTER — Encounter: Payer: Self-pay | Admitting: Family Medicine

## 2018-08-10 ENCOUNTER — Telehealth: Payer: 59 | Admitting: Physician Assistant

## 2018-08-10 DIAGNOSIS — B9689 Other specified bacterial agents as the cause of diseases classified elsewhere: Secondary | ICD-10-CM

## 2018-08-10 DIAGNOSIS — J019 Acute sinusitis, unspecified: Secondary | ICD-10-CM

## 2018-08-10 MED ORDER — AMOXICILLIN-POT CLAVULANATE 875-125 MG PO TABS: 1.0000 | ORAL_TABLET | Freq: Two times a day (BID) | ORAL | 0 refills | Status: AC

## 2018-08-10 NOTE — Progress Notes (Signed)

## 2018-08-23 ENCOUNTER — Telehealth: Payer: 59 | Admitting: Family

## 2018-08-23 DIAGNOSIS — J019 Acute sinusitis, unspecified: Secondary | ICD-10-CM | POA: Diagnosis not present

## 2018-08-23 MED ORDER — AMOXICILLIN-POT CLAVULANATE 875-125 MG PO TABS: 1.0000 | ORAL_TABLET | Freq: Two times a day (BID) | ORAL | 0 refills | Status: AC

## 2018-08-23 NOTE — Progress Notes (Signed)

## 2018-08-28 IMAGING — CT CT RENAL STONE PROTOCOL
2 of 4 series · 16 of 46 positions shown, 18 images · non-contrast
Comparison: None.

CLINICAL DATA: Patient with acute onset right flank pain.

EXAM:
CT ABDOMEN AND PELVIS WITHOUT CONTRAST
TECHNIQUE: Multidetector CT imaging of the abdomen and pelvis was performed
following the standard protocol without IV contrast.

[Series 2: axial st · axial · 0.86mm/px · z∈[-479,-44]mm · 13 of 95 slices shown, 15 images]
[im 4/95  soft-tissue]
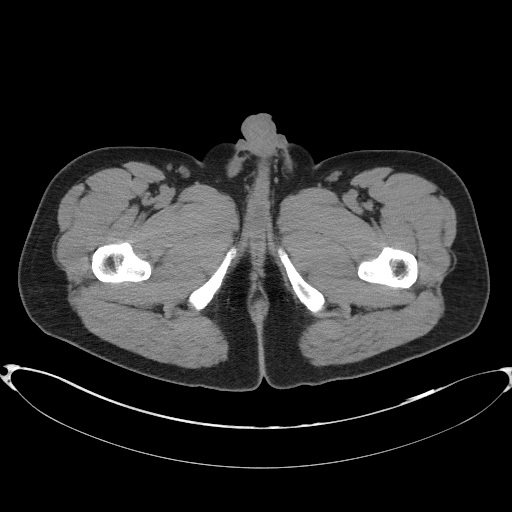
[im 4/95  bone]
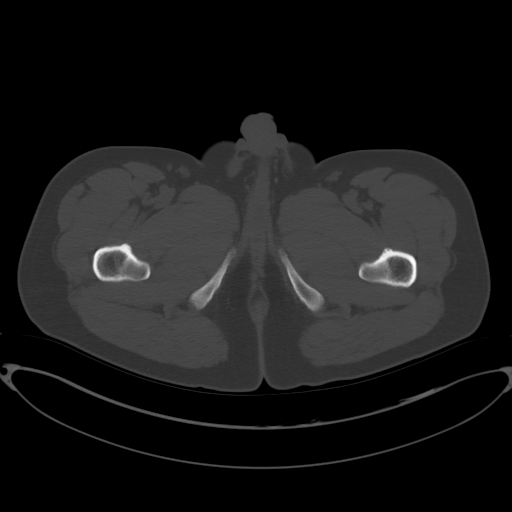
[im 11/95  soft-tissue]
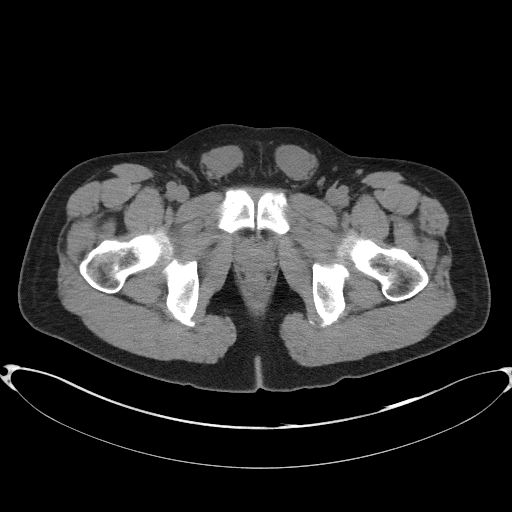
[im 19/95  soft-tissue]
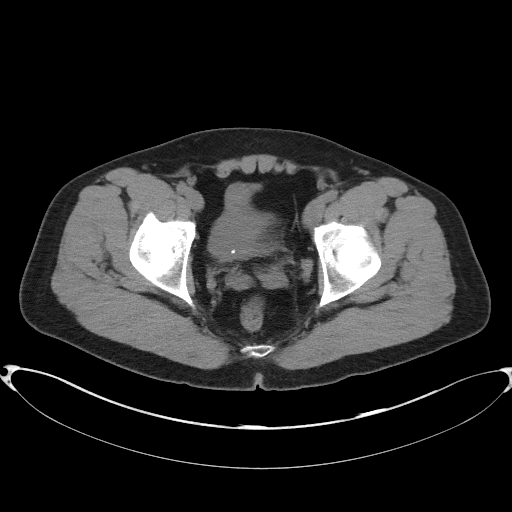
[im 26/95  soft-tissue]
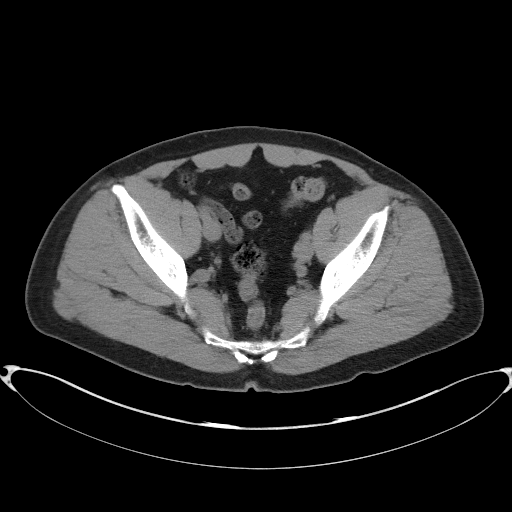
[im 33/95  soft-tissue]
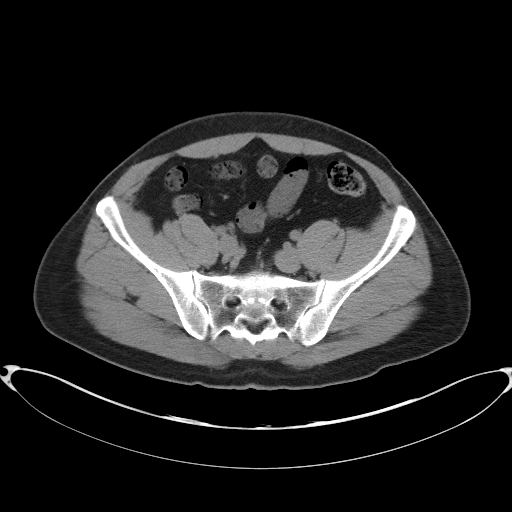
[im 40/95  soft-tissue]
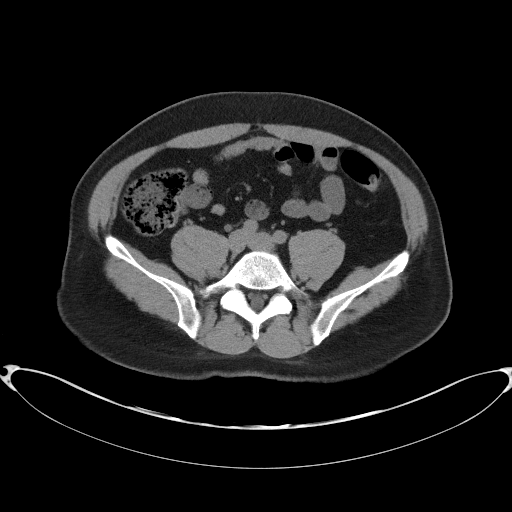
[im 48/95  soft-tissue]
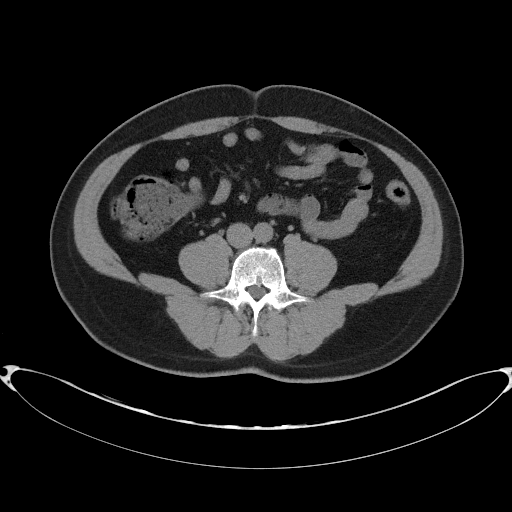
[im 55/95  soft-tissue]
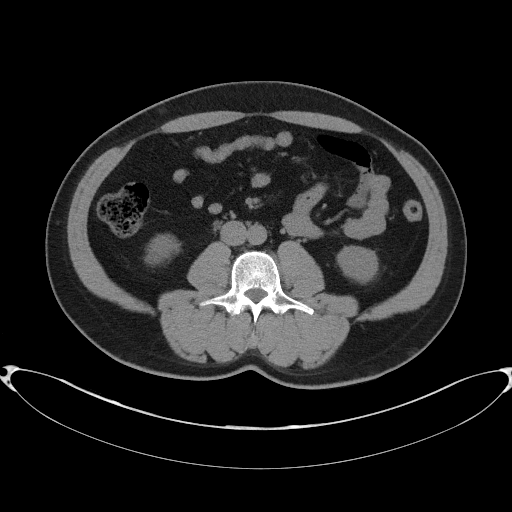
[im 62/95  soft-tissue]
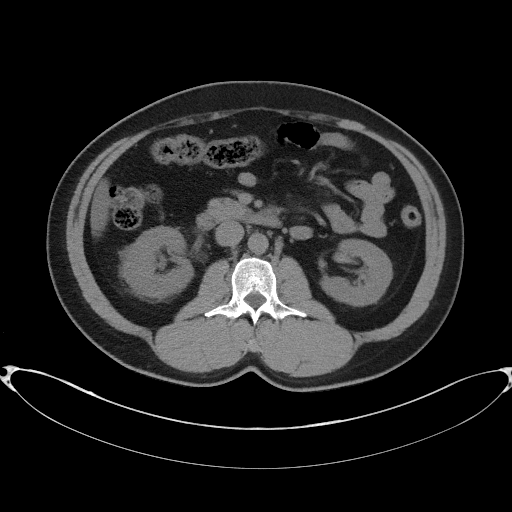
[im 62/95  bone]
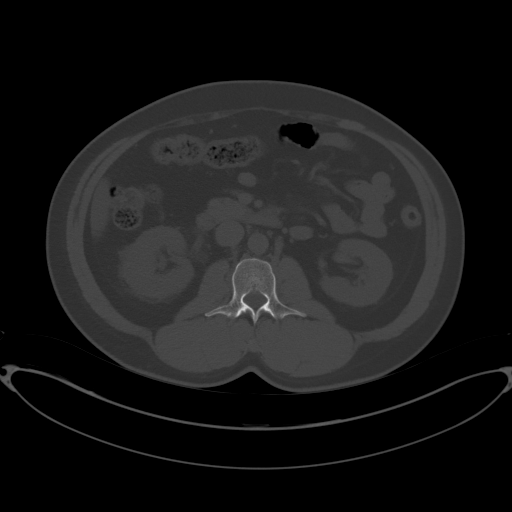
[im 69/95  soft-tissue]
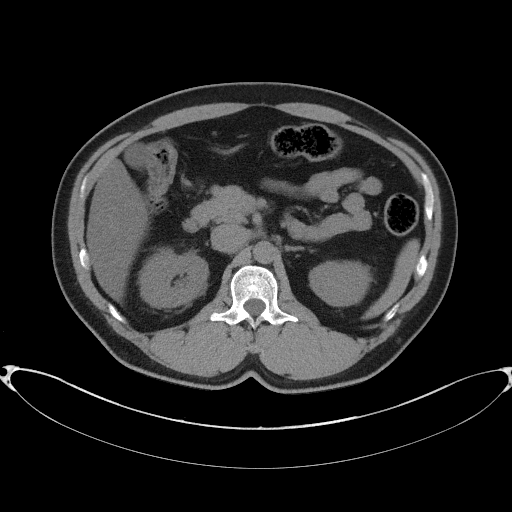
[im 76/95  soft-tissue]
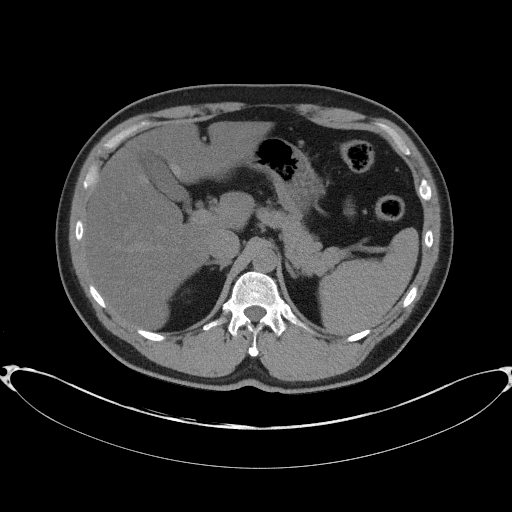
[im 84/95  soft-tissue]
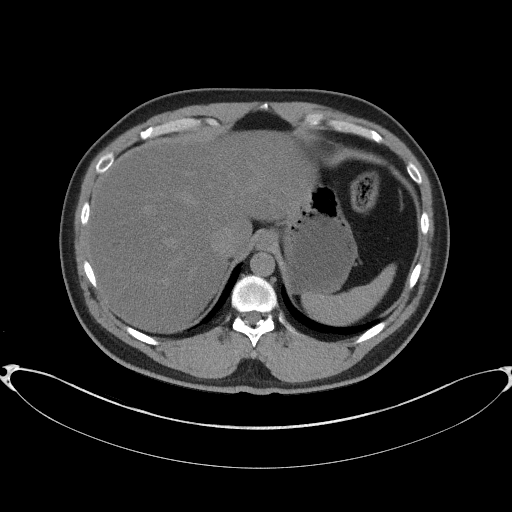
[im 91/95  soft-tissue]
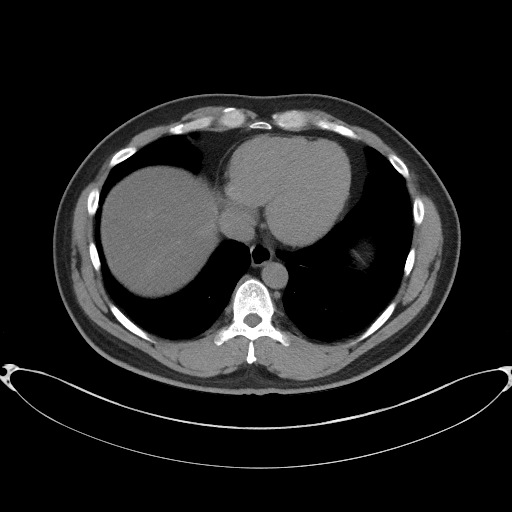

[Series 5: coronal st · coronal · 0.88mm/px · 3 of 86 slices shown]
[im 29/86  soft-tissue]
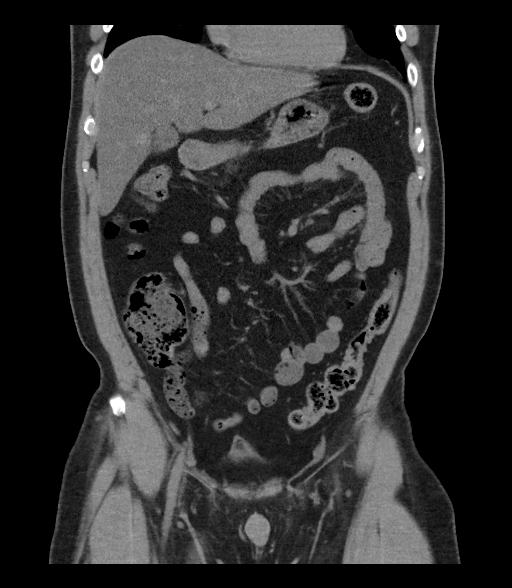
[im 38/86  soft-tissue]
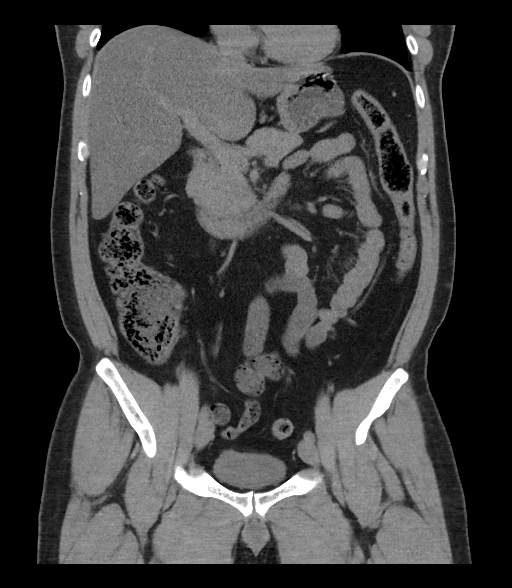
[im 48/86  soft-tissue]
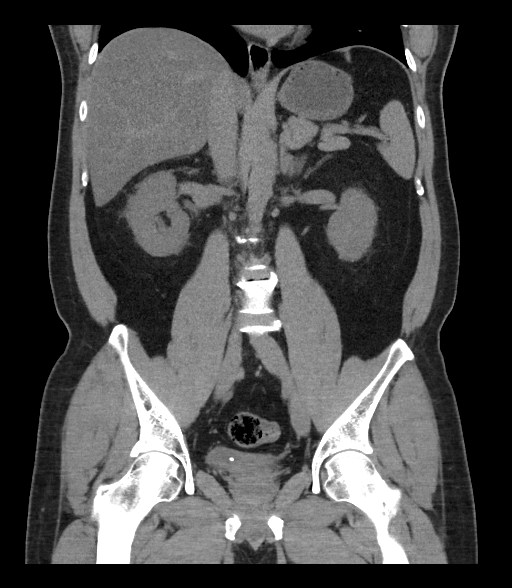

[16 of 46 positions shown; findings below may reference images not displayed]

FINDINGS: Lower chest: Normal heart size. Dependent atelectasis within the
bilateral lower lobes.

Hepatobiliary: Liver is diffusely low in attenuation compatible with
steatosis. High attenuation adjacent to the gallbladder fossa
favored to represent focal fatty sparing. Gallbladder is
unremarkable.

Pancreas: Unremarkable

Spleen: Unremarkable

Adrenals/Urinary Tract: The adrenal glands are normal. The right
kidney is edematous with perinephric fat stranding. There is mild to
moderate right hydronephrosis and ureterectasis to the level of the
distal right ureter at the UVJ were there is an obstructing 4 mm
stone (image 78; series 2). No additional renal stones are
demonstrated bilaterally.

Stomach/Bowel: No abnormal bowel wall thickening or evidence for
bowel obstruction. No free fluid or free intraperitoneal air. Normal
morphology of the stomach.

Vascular/Lymphatic: Normal caliber abdominal aorta. No
retroperitoneal lymphadenopathy.

Reproductive: Prostate is unremarkable.

Other: Soft tissue within the inguinal canals bilaterally favored to
represent mildly retracted testicles.

Musculoskeletal: No aggressive or acute appearing osseous lesions.
IMPRESSION: Obstructing 4 mm stone within the distal right ureter at the UVJ
with mild to moderate right hydroureteronephrosis. No additional
renal stones are identified.

Hepatic steatosis.

## 2018-09-03 ENCOUNTER — Telehealth: Payer: 59 | Admitting: Family

## 2018-09-03 DIAGNOSIS — B9689 Other specified bacterial agents as the cause of diseases classified elsewhere: Secondary | ICD-10-CM

## 2018-09-03 DIAGNOSIS — J329 Chronic sinusitis, unspecified: Secondary | ICD-10-CM | POA: Diagnosis not present

## 2018-09-03 MED ORDER — CEFDINIR 300 MG PO CAPS: 300.0000 mg | ORAL_CAPSULE | Freq: Two times a day (BID) | ORAL | 0 refills | Status: AC

## 2018-09-03 MED ORDER — FLUTICASONE PROPIONATE 50 MCG/ACT NA SUSP: 1.0000 | Freq: Two times a day (BID) | NASAL | 6 refills | Status: AC

## 2018-09-03 NOTE — Progress Notes (Signed)
Thank you for the details you included in the comment boxes. Those details are very helpful in determining the best course of treatment for you and help [place] to provide the best care. Based on your E-visit done 08/23/18, you have been sick for at least 17 days. Apparently, the Augmentin did not help you. Therefore, we can utilize Cefdinir.   Regarding prednisone, there is no evidence to support this during a sinus infection; therefore we cannot give it in this case. However, you may use Flonase locally to help. I will send a prescription for that as well; Flonase 2 sprays per nare daily. See plan below.  We are sorry that you are not feeling well.  Here is how we plan to help!  Based on what you have shared with me it looks like you have sinusitis.  Sinusitis is inflammation and infection in the sinus cavities of the head.  Based on your presentation I believe you most likely have Acute Bacterial Sinusitis.  This is an infection caused by bacteria and is treated with antibiotics. I have prescribed Omnicef 300mg  by mouth twice daily for 10 days. You may use an oral decongestant such as Mucinex D or if you have glaucoma or high blood pressure use plain Mucinex. Saline nasal spray help and can safely be used as often as needed for congestion.  If you develop worsening sinus pain, fever or notice severe headache and vision changes, or if symptoms are not better after completion of antibiotic, please schedule an appointment with a health care provider.    Sinus infections are not as easily transmitted as other respiratory infection, however we still recommend that you avoid close contact with loved ones, especially the very young and elderly.  Remember to wash your hands thoroughly throughout the day as this is the number one way to prevent the spread of infection!  Home Care:  Only take medications as instructed by your medical team.  Complete the entire course of an antibiotic.  Do not take these  medications with alcohol.  A steam or ultrasonic humidifier can help congestion.  You can place a towel over your head and breathe in the steam from hot water coming from a faucet.  Avoid close contacts especially the very young and the elderly.  Cover your mouth when you cough or sneeze.  Always remember to wash your hands.  Get Help Right Away If:  You develop worsening fever or sinus pain.  You develop a severe head ache or visual changes.  Your symptoms persist after you have completed your treatment plan.  Make sure you  Understand these instructions.  Will watch your condition.  Will get help right away if you are not doing well or get worse.  Your e-visit answers were reviewed by a board certified advanced clinical practitioner to complete your personal care plan.  Depending on the condition, your plan could have included both over the counter or prescription medications.  If there is a problem please reply  once you have received a response from your provider.  Your safety is important to [place].  If you have drug allergies check your prescription carefully.    You can use MyChart to ask questions about today's visit, request a non-urgent call back, or ask for a work or school excuse for 24 hours related to this e-Visit. If it has been greater than 24 hours you will need to follow up with your provider, or enter a new e-Visit to address those concerns.  You will get an e-mail in the next two days asking about your experience.  I hope that your e-visit has been valuable and will speed your recovery. Thank you for using e-visits.

## 2019-10-24 IMAGING — US US EXTREM LOW*L* LIMITED
1 series · 13 of 25 positions shown · non-contrast
Comparison: None.

CLINICAL DATA: 2 palpable masses in the left anterior thigh.

EXAM:
ULTRASOUND LEFT LOWER EXTREMITY LIMITED
TECHNIQUE: Ultrasound examination of the lower extremity soft tissues was
performed in the area of clinical concern.

[Series 1: us extrem low*left* limited · 0.06mm/px · 13 of 36 slices shown]
[im 1/36]
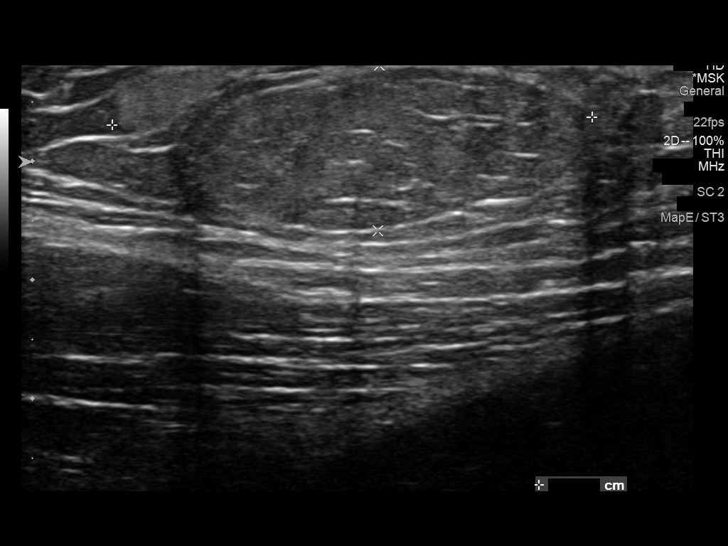
[im 3/36]
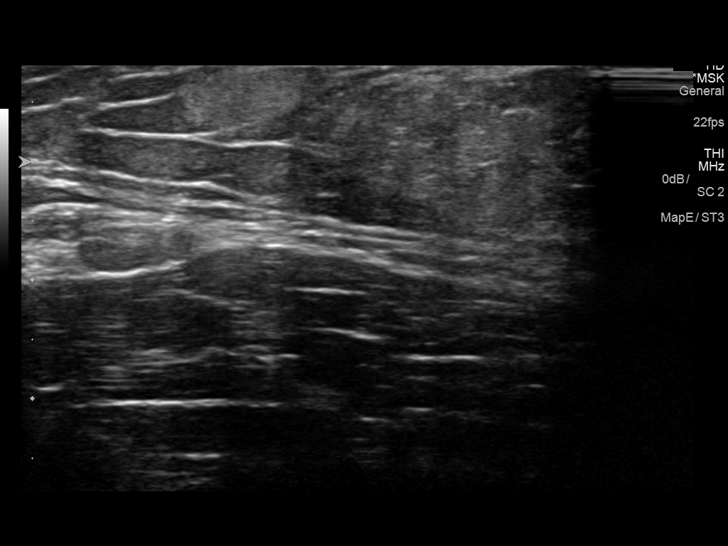
[im 6/36]
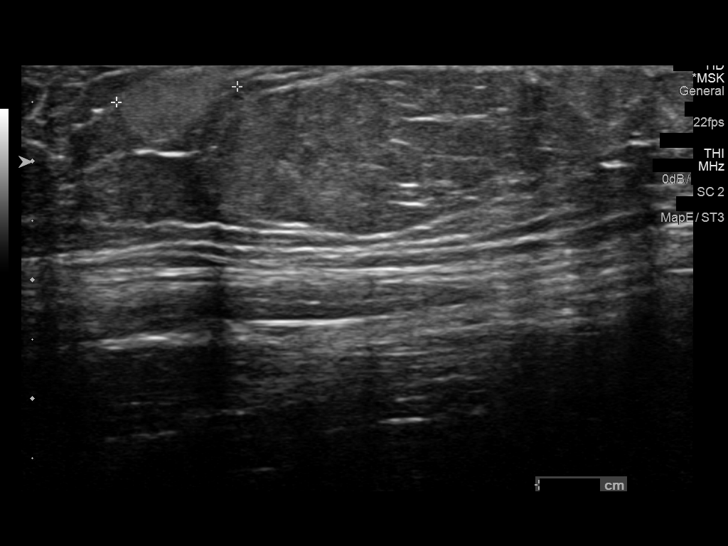
[im 9/36]
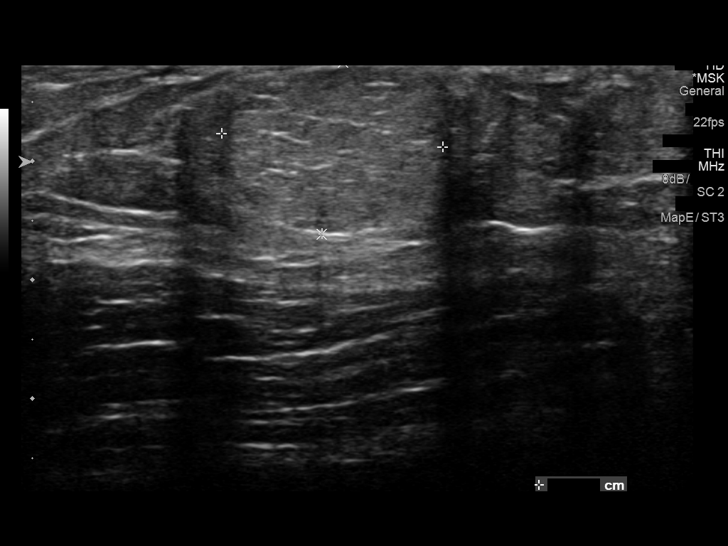
[im 12/36]
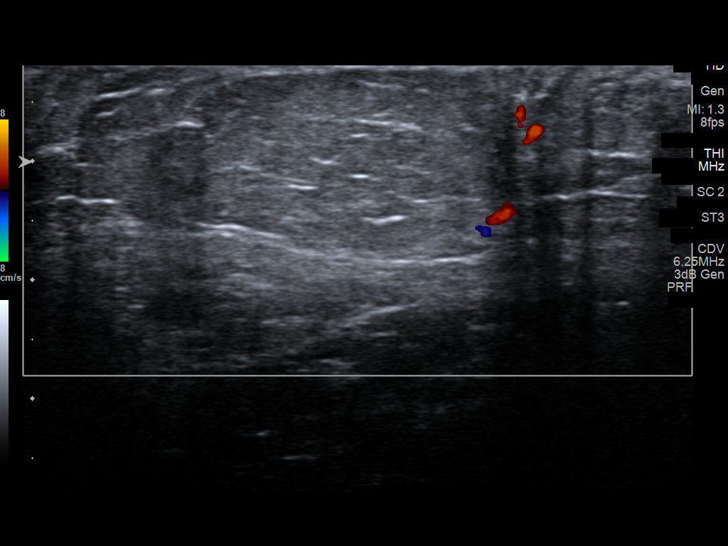
[im 15/36]
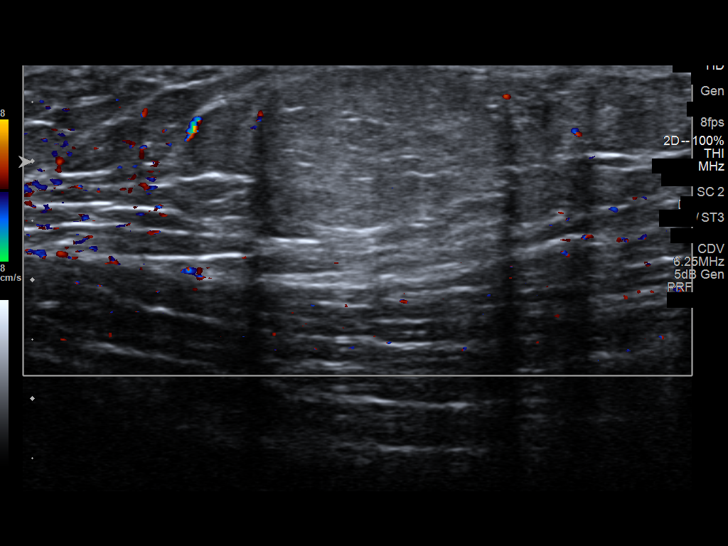
[im 18/36]
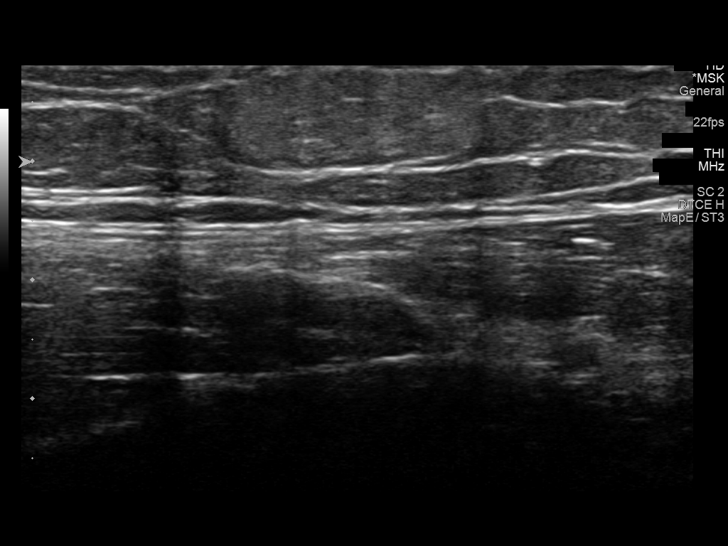
[im 21/36]
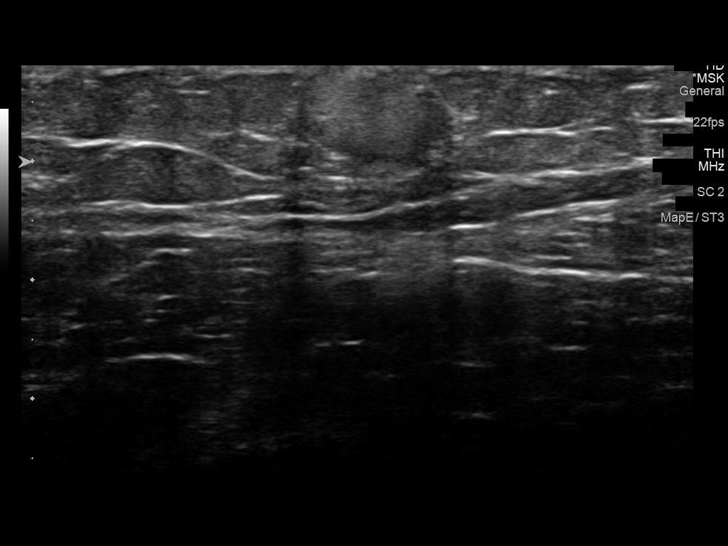
[im 24/36]
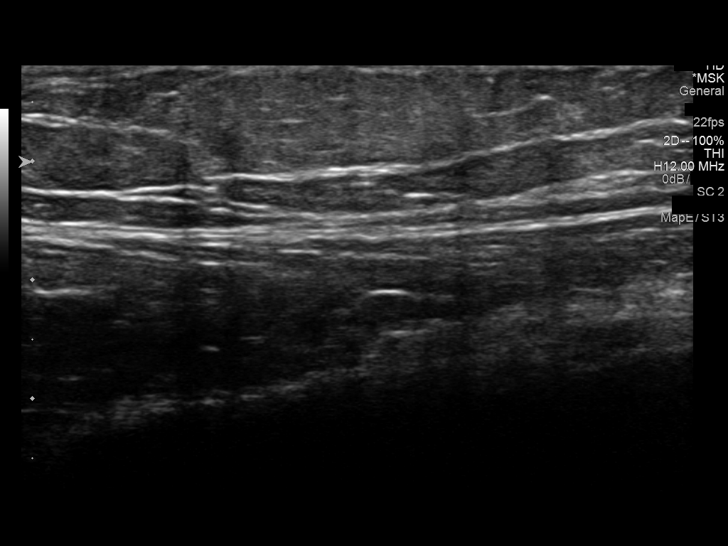
[im 27/36]
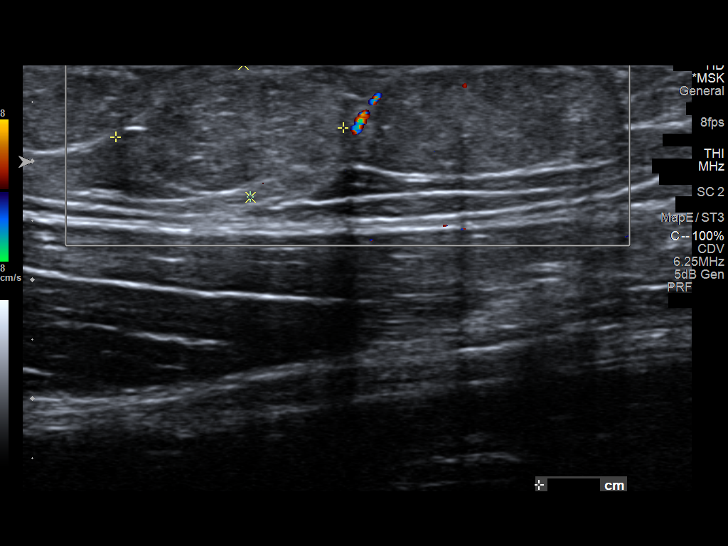
[im 30/36]
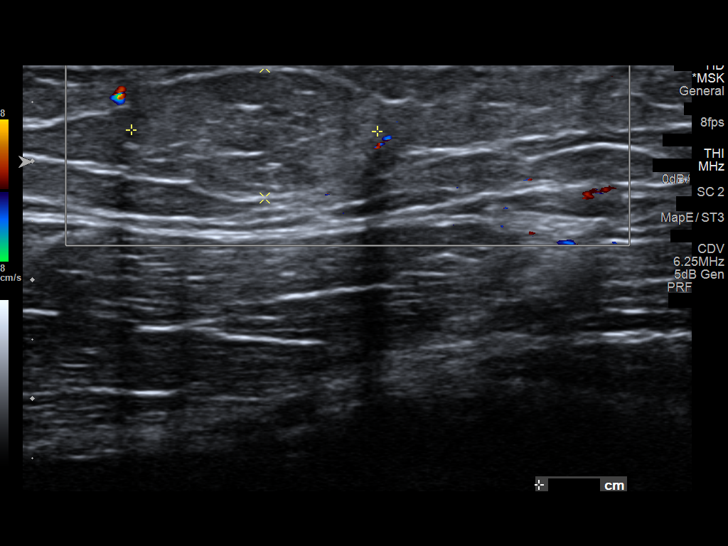
[im 33/36]
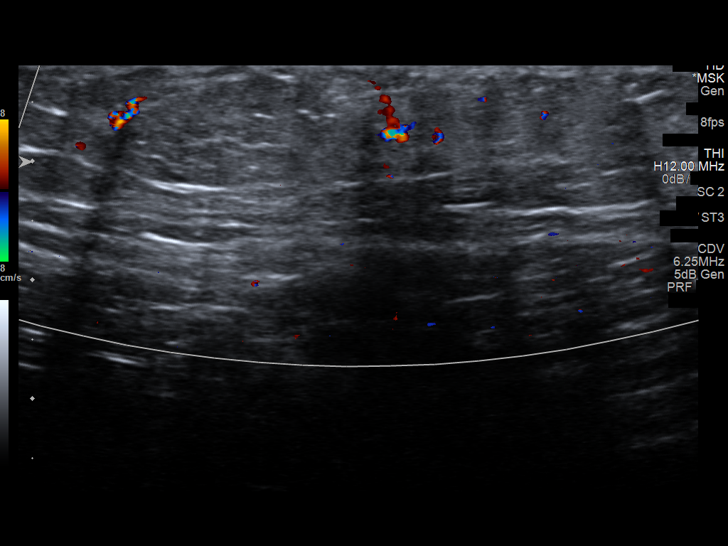
[im 36/36]
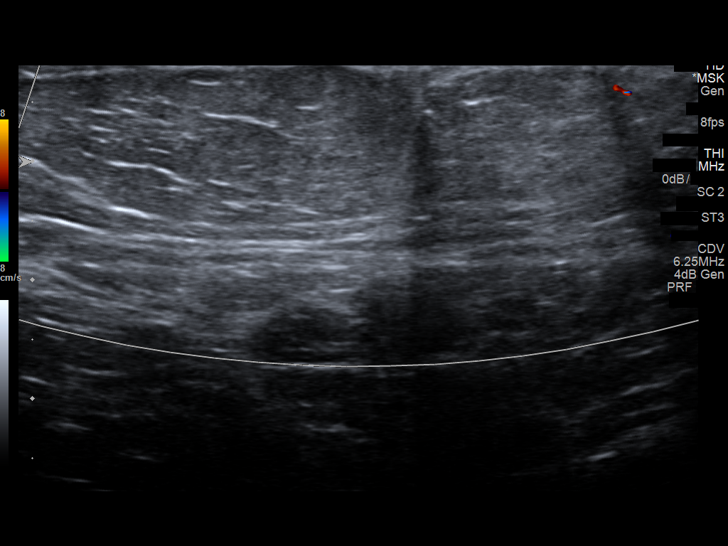

[13 of 25 positions shown; findings below may reference images not displayed]

FINDINGS: In the area of palpable abnormality in the upper outer thigh, there
is a 4.0 x 1.4 x 3.4 cm oval, well-circumscribed mass isoechoic to
fat with minimal internal vascularity, likely representing a lipoma.

Immediately adjacent to this mass, there is a smaller 1.2 x 0.7 x
01.0 cm oval, well-circumscribed, echogenic mass with a small amount
of internal vascularity, nonspecific, possibly a small lymph node.

Just inferior to these masses, there is a 2.1 x 1.4 x 1.9 cm oval,
well-circumscribed mass isoechoic to fat with minimal of internal
vascularity, also likely representing an lipoma.

More inferiorly in the anterior outer thigh, there are two
additional oval, well-circumscribed masses immediately adjacent to
each other that demonstrate slightly more internal vascularity than
the other lesions. The lesions are similar in size, measuring 2.2 x
1.0 x 1.7 cm and 2.1 x 1.1 x 1.9 cm, and could represent one larger,
lobulated mass.
IMPRESSION: 1. Palpable abnormalities in the left thigh corresponding to
multiple lesions as described above, with sonographic
characteristics suggestive of lipomas. However, the two more
inferior masses in the anterior mid to distal thigh demonstrate
slightly more internal vascularity than expected, and could in fact
be one larger, lobulated mass. Therefore, a MRI of the left thigh
with and without contrast is recommended for further evaluation to
exclude well differentiated liposarcoma.
These results will be called to the ordering clinician or
representative by the Radiologist Assistant, and communication
documented in the PACS or [employer]Vision Dashboard.
